# Patient Record
Sex: Male | Born: 1940 | Race: White | Hispanic: No | Marital: Single
Health system: Southern US, Community
[De-identification: ages and names within clinical notes are randomized; demographics above are authoritative.]

---

## 2014-06-25 ENCOUNTER — Ambulatory Visit
Admit: 2014-06-25 | Disposition: A | Discharge status: Home / Self Care | Payer: Self-pay | Attending: Internal Medicine | Admitting: Internal Medicine

## 2014-07-15 ENCOUNTER — Inpatient Hospital Stay: Payer: Self-pay | Admitting: Internal Medicine

## 2014-07-16 ENCOUNTER — Ambulatory Visit
Admit: 2014-07-16 | Disposition: A | Discharge status: Home / Self Care | Payer: Self-pay | Attending: Oncology | Admitting: Oncology

## 2014-07-22 ENCOUNTER — Emergency Department: Payer: Self-pay | Admitting: Emergency Medicine

## 2014-07-22 LAB — CANCER ANTIGEN 19-9: CA 19-9: 19300 U/mL — ABNORMAL HIGH (ref 0–35)

## 2014-07-26 ENCOUNTER — Ambulatory Visit
Admit: 2014-07-26 | Disposition: A | Discharge status: Home / Self Care | Payer: Self-pay | Attending: Internal Medicine | Admitting: Internal Medicine

## 2014-07-26 DEATH — deceased

## 2014-08-25 NOTE — Consult Note (Signed)
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 HPI:   Patient is a 74 year old male who recently moved [place] 9 days ago and was admitted to the ER with likely malignant ascites, left lower extremity cellulitis and DVT. He had a paracentesis and feels significantly improved. He was diagnosed with his pancreatic cancer in November or December 2015. He underwent minimal chemotherapy using capecitabine and oxaliplatin and was noted to have progression of disease on a recent CT scan on June 25, 2014. Patient expressed interest in transferring his care to [facility]. He has no neurologic complaints.  He denies any fevers. He has no chest pain or shortness of breath. He denies any nausea, vomiting, constipation, or diarrhea. He has no urinary complaints. Patient otherwise feels well and offers no further specific complaints.   Review of Systems:  Performance Status (ECOG): 1  Pain ?: No complaints (0, none)  Emotional well-being: None  Review of Systems:   As per HPI. Otherwise, a complete review of systems is negative.   Allergies:  No Known Allergies:   PFSH: Additional Past Medical and Surgical History: stage IV pancreatic cancer, biliary stenting, CAD, GERD, panic attacks. CABG.    Family history: 4 siblings deceased from lung cancer. Both mother and father also deceased from cancer unknown type.    Social history: Patient denies tobacco or alcohol.   Home Medications: Medication Instructions Last Modified Date/Time  fentaNYL 50 mcg/hr transdermal film, extended release 1 patch transdermal every 72 hours 21-Mar-16 11:16  omeprazole 40 mg oral delayed release capsule 1 cap(s) orally once a day 21-Mar-16 11:16  furosemide 20 mg oral tablet 1 tab(s) orally once a day 21-Mar-16 11:16  morphine 15 mg oral tablet 1 tab(s) orally every 4 hours, As Needed 21-Mar-16 11:16  megestrol 40 mg/mL oral suspension 40  milliliter(s) orally once a day 21-Mar-16 11:16   Vital Signs:  :: vital signs stable, patient afebrile.   Physical Exam:  General: well developed, well nourished, and in no acute distress  Mental Status: normal affect  Eyes: anicteric sclera  Head, Ears, Nose,Throat: Normocephalic, moist mucous membranes, clear oropharynx without erythema or thrush.  Neck, Thyroid: No palpable lymphadenopathy, thyroid midline without nodules.  Respiratory: clear to auscultation bilaterally  Cardiovascular: regular rate and rhythm, no murmur, rub, or gallop  Gastrointestinal: soft, nondistended, nontender, no organomegaly.  normal active bowel sounds  Musculoskeletal: No edema  Skin: No rash or petechiae noted  Neurological: alert, answering all questions appropriately.  Cranial nerves grossly intact   Laboratory Results: Hepatic:  22-Mar-16 05:35   Bilirubin, Total  3.5 (0.3-1.2 NOTE: New Reference Range  06/18/14)  Alkaline Phosphatase  298 (38-126 NOTE: New Reference Range  06/18/14)  SGPT (ALT)  13 (17-63 NOTE: New Reference Range  06/18/14)  SGOT (AST) 31 (15-41 NOTE: New Reference Range  06/18/14)  Total Protein, Serum  4.5 (6.5-8.1 NOTE: New Reference Range  06/18/14)  Albumin, Serum  1.6 (3.5-5.0 NOTE: New reference range  06/18/14)  Routine Chem:  22-Mar-16 05:35   Result Comment LABS - This specimen was collected through an   - indwelling catheter or arterial line.  - A minimum of 55ms of blood was wasted prior    - to collecting the sample.  Interpret  - results with caution.  Result(s) reported on 16 Jul 2014 at 06:06AM.  Glucose, Serum  102 (65-99 NOTE: New Reference Range  06/18/14)  BUN  34 (6-20 NOTE:  New Reference Range  06/18/14)  Creatinine (comp)  1.42 (0.61-1.24 NOTE: New Reference Range  06/18/14)  Sodium, Serum 136 (135-145 NOTE: New Reference Range  06/18/14)  Potassium, Serum 3.8 (3.5-5.1 NOTE: New Reference Range  06/18/14)  Chloride, Serum 106  (101-111 NOTE: New Reference Range  06/18/14)  CO2, Serum 26 (22-32 NOTE: New Reference Range  06/18/14)  Calcium (Total), Serum  7.6 (8.9-10.3 NOTE: New Reference Range  06/18/14)  eGFR (African American)  56  eGFR (Non-African American)  48 (eGFR values <79m/min/1.73 m2 may be an indication of chronic kidney disease (CKD). Calculated eGFR is useful in patients with stable renal function. The eGFR calculation will not be reliable in acutely ill patients when serum creatinine is changing rapidly. It is not useful in patients on dialysis. The eGFR calculation may not be applicable to patients at the low and high extremes of body sizes, pregnant women, and vegetarians.)  Anion Gap  4  Routine Hem:  22-Mar-16 05:35   WBC (CBC) 9.0  RBC (CBC)  2.70  Hemoglobin (CBC)  8.9  Hematocrit (CBC)  27.2  Platelet Count (CBC)  121  MCV  101  MCH 32.8  MCHC 32.6  RDW  16.9  Neutrophil % 77.0  Lymphocyte % 13.1  Monocyte % 8.1  Eosinophil % 1.5  Basophil % 0.3  Neutrophil #  7.0  Lymphocyte # 1.2  Monocyte # 0.7  Eosinophil # 0.1  Basophil # 0.0   Medical Imaging Results:   Review Medical Imaging   Color Flow Doppler Low Extrem Left (Leg) 15-Jul-2014 13:36:00: IMPRESSION:  1. Extensive occlusive LEFT DVT extending from the peroneal vein  through the femoral-popliteal system.      Electronically Signed    By: DLucrezia EuropeM.D.    On: 07/15/2014 13:45         Verified By: DKandis Cocking M.D., Guided Paracentesis 15-Jul-2014 15:01:00: IMPRESSION:  Successful ultrasound guided paracentesis yielding 9.2 L of ascites.      Electronically Signed    By: AMarkus DaftM.D.    On: 07/15/2014 15:06         Verified By: ABurman Riis M.D.,  Assessment and Plan: Impression:   Stage IV pancreatic cancer with metastatic disease in his liver, DVT. Plan:   1. Pancreatic cancer: [place], patient expressed  interest in attempting a different regimen of chemotherapy. He also expressed interest and not disrupting his quality of life. Will get a CA-19-9 for completeness. [place] and treatment planning.DVT: Agree with Eliquis.Family dynamics: Patient has clearly stated that he does not wish to discuss his care with his estranged wife or [place] His niece who was not present at the time is his power of attorney. palliative care input. consult, call with questions.  Electronic Signatures: FDelight Hoh(MD)  (Signed 22-Mar-16 13:32)  Authored: Note Type, CC/HPI, Review of Systems, ALLERGIES, Patient Family Social History, HOME MEDICATIONS, Vital Signs, Physical Exam, Lab Results Review, Rad Results Review, Assessment and Plan   Last Updated: 22-Mar-16 13:32 by FDelight Hoh(MD)

## 2014-08-25 NOTE — Discharge Summary (Signed)
PATIENT NAME:  [name], Micheal W MR#:  [id number] DATE OF BIRTH:  01-10-1941  DATE OF ADMISSION:  07/15/2014 DATE OF DISCHARGE:  07/16/2014  CHIEF COMPLAINT: Abdominal discomfort.   DISCHARGE DIAGNOSES:  Left lower extremity extensive deep vein thrombosis and cellulitis.  2.  Pancreatic cancer. 3.  Status post paracentesis.  4.  Cachexia and malnutrition.   PROCEDURES: Abdominal paracentesis removal of 9 liters.   CODE STATUS: None, DO NOT RESUSCITATE.   MEDICATIONS:  1.  Fentanyl patch 50 mcg 1 every 72 hours.  2.  Omeprazole 40 mg daily.  3.  Lasix 20 mg daily.  4.  Morphine 15 mg p.o. every 4 hours as needed.  5.  Megace 400 mg p.o. daily.  6.  Eliquis 10 mg b.i.d. for 7 days and then 5 mg b.i.d.  7.  Trazodone 75 mg at bedtime.  8.  Alprazolam 0.25 b.i.d. as needed for anxiety.  9.  Keflex 500 mg eight hourly.  10.  Ensure Plus 1 can b.i.d.   DIET: Low sodium.   FOLLOWUP: With Dr. Orlie DakinFinnegan on your scheduled appointment.   The patient and family advised on finding primary care physician in the area.   CONSULTATIONS: 1.  Oncology consult Dr. Orlie DakinFinnegan. 2.  Palliative care consult.   LABORATORY DATA:   1.  H8.9 and 37.2. White count is 9.0, platelet count is 121,000.  Creatinine is 1.42, BUN is 34, glucose is 102, sodium is 136, potassium is 3.8, chloride is 106, bicarbonate is 26. Urinalysis negative for UTI.  2.  Ultrasound of the lower extremities showed extensive left DVT extending through the femoral popliteal system.  3.  PT/INR 17.4 and 1.4.   BRIEF SUMMARY OF HOSPITAL COURSE:  Mr. Micheal Ball is a 74 year old Caucasian gentleman who [place] He has history of pancreatic cancer and came in with:  1.  Cellulitis of the left lower extremity with leukocytosis, tachycardia consistent with systemic inflammatory response syndrome.  He was started on IV Ancef, changed to p.o. Keflex. His redness was improving.  2.  New extensive DVT left  lower extremity. The patient was started on Eliquis 10 mg b.i.d. and then 10 mg b.i.d. for 7 days and will continue 5 mg b.i.d. thereafter.  3.  Acute renal failure. Received some IV fluids.  4.  Abdominal pain with ascites likely from massive ascites.  The patient underwent paracentesis with removal of 9.2 liters of fluid.  5.  Pancreatic cancer with jaundice with recent biliary stent placement The patient was seen by oncology and palliative care.  He is a DO NOT RESUSCITATE. Will follow up with Dr. Orlie DakinFinnegan for further options on treatment.  6.  Chronic pain. Continue fentanyl patch.  7.  Malnutrition with low albumin and cachexia.   The patient was desperate to go home.  I spoke with the patient's niece who is the primary caregiver and agrees with patient going home.  Hospital stay otherwise remained stable. The patient carries a poor prognosis.   TIME SPENT: 40 minutes.    ____________________________ Wylie HailSona A. Allena KatzPatel, MD sap:sp D: 07/18/2014 06:46:37 ET T: 07/18/2014 11:58:15 ET JOB#: [id number]  cc: Dominick Zertuche A. Allena KatzPatel, MD, <Dictator> Tollie Pizzaimothy J. Orlie DakinFinnegan, MD Willow OraSONA A Aleshka Corney MD ELECTRONICALLY SIGNED 07/29/2014 12:20

## 2014-08-25 NOTE — H&P (Signed)
PATIENT NAME:  Micheal Ball, Micheal Ball MR#:  [id number] DATE OF BIRTH:  08/13/1940  DATE OF ADMISSION:  07/15/2014  PRIMARY CARE PHYSICIAN:  Up in [place]. Patient moved to [facility] 9 days ago.   CHIEF COMPLAINT: Abdominal discomfort.   HISTORY OF PRESENT ILLNESS: This is a 74 year old man who was undergoing treatment for pancreatic cancer at [facility]. He has also been receiving paracentesis to remove fluid. His abdomen got more distended and he was having pain because of the distention. He went for paracentesis in the ER and had 9.2 liters drawn off with paracentesis. Now his abdomen feels better, but also while he was in the ER, he was diagnosed with a DVT and also has pain on that left lower extremity with some redness there. Hospitalist services were contacted for further evaluation. The patient complains of 3/10 pain in his abdomen down to his usual, but it was up at 8/10 prior to coming in here. He has been having more fluid in his abdomen for the past 3 days. He has been having some nausea and spitting up. He has already received 7 or 8 cycles of chemotherapy up at [facility] for his pancreatic cancer and a recent stent for jaundice. He has been having weight loss and fatigue, some pain in his left lower extremity. Hospitalist services were contacted for further evaluation.   PAST MEDICAL HISTORY: Pancreatic cancer. Jaundice, status post biliary stenting, coronary artery disease, panic attacks, insomnia, gastroesophageal reflux disease.   PAST SURGICAL HISTORY: CABG, biliary stent.  ALLERGIES: No known drug allergies.   MEDICATIONS: As per prescription writer include fentanyl 50 mcg chest wall every 72 hours, furosemide 20 mg daily, Megace 40 mg/mL 40 mL once a day, morphine 15 mg every 4 hours as needed, omeprazole 40 mg daily.   SOCIAL HISTORY: No smoking. No alcohol. No drug use. Recently separated from his wife. Lives now with niece over at the [facility]. He used to work  [employer] in the past.   FAMILY HISTORY: Two brothers died of cancer, a sister died of cancer. Father died of cancer. Mother died of cancer, unknown cancer types.  REVIEW OF SYSTEMS:  CONSTITUTIONAL: Positive for chills. No fever. No sweats.  Positive for weight loss. Positive for fatigue.  EYES: No blurry vision.  EARS, NOSE, MOUTH, AND THROAT: No hearing loss. No sore throat. No difficulty swallowing.  CARDIOVASCULAR: No chest pain. No palpitations.  RESPIRATORY: No shortness of breath. No cough. No sputum. No hemoptysis.  GASTROINTESTINAL: Positive for nausea, spitting up, positive for abdominal pain. Positive for constipation. No bright red blood per rectum. No melena.  GENITOURINARY: No burning on urination. No hematuria.  MUSCULOSKELETAL: No joint pain or muscle pain.  INTEGUMENT: Positive for rash on the left lower extremity.  NEUROLOGIC: No fainting or blackouts.  PSYCHIATRIC: Positive for anxiety, positive for depression.  ENDOCRINE: No thyroid problems.  HEMATOLOGIC AND LYMPHATIC: No anemia, no easy bruising, or bleeding.   PHYSICAL EXAMINATION: VITAL SIGNS: On presentation to the ER include pulse 104, respirations 20, blood pressure 125/100, pulse oximetry 100% on room air, temperature 97.9. Current temperature 97.9, pulse 82, respirations 18, blood pressure 108/79, pulse oximetry 99% on room air.  GENERAL: No respiratory distress, cachectic.  EYES: Conjunctivae jaundiced, lids normal. Pupils equal, round, and reactive to light. Extraocular muscles intact. No nystagmus.  EARS, NOSE, MOUTH, AND THROAT: Tympanic membranes, no erythema. Nasal mucosa, no erythema. Throat, no erythema. No exudate seen. Lips and gums, no lesions.  NECK: No  JVD. No bruits. No lymphadenopathy. No thyromegaly. No thyroid nodules palpated.  RESPIRATORY:  Lungs clear to auscultation. No use of accessory muscles to breathe. No rhonchi, rales, or wheeze heard.  CARDIOVASCULAR: S1, S2 normal. No gallops,  rubs, or murmurs heard. Carotid upstroke 2+ bilaterally. No bruits. Dorsalis pedis pulses 2+ bilaterally, 2+ edema bilateral lower extremity.  ABDOMEN: Soft. Slight tenderness, generalized. No organomegaly/splenomegaly. Normoactive bowel sounds.  LYMPHATIC: No lymph nodes in the neck.  MUSCULOSKELETAL: No clubbing, no cyanosis.  SKIN: No ulcers seen. Positive rash, redness, and warmth of the left lower extremity, a little bit above the ankle and below the knee anteriorly consistent with cellulitis.  NEUROLOGIC: Cranial nerves II-XII grossly intact. Deep tendon reflexes are 1+ bilateral lower extremities.  PSYCHIATRIC: The patient is oriented to person, place, and time. 2+ edema bilateral lower extremity. No clubbing. No cyanosis.   LABORATORY AND RADIOLOGICAL DATA: Troponin borderline at 0.04, PT 17.4, INR 1.4. Lipase 45. White blood cell count 12.7, H and H, 10.1 and 31.2, platelet count of 177,000, glucose 132, BUN 37, creatinine 1.72, sodium 134, potassium 4.0, chloride 103, CO2 of 21, calcium 8.0, total bilirubin 4.8, alkaline phosphatase 410, ALT 18, AST 39, total protein 5.8, albumin 1.9. Ultrasound of the left lower extremity showed extensive, occlusive left DVT  extending from the peroneal vein through the femoral popliteal system. Ultrasound-guided paracentesis, total of approximately 9.2 liters of amber-colored fluid was removed.   ASSESSMENT AND PLAN: 1.  Cellulitis of the left lower extremity with leukocytosis, tachycardia consistent with systemic inflammatory response syndrome. We will give intravenous Ancef.  2.  Acute renal failure, likely acute tubular necrosis. We will give gentle intravenous fluid hydration and hold Lasix at this time.   3.  New deep venous thrombosis of the left lower extremity. We will discontinue Megace, start Eliquis 10 mg b.i.d., then can go down to 5 mg b.i.d. We will have to be dosed on kidney function upon discharge.  4.  Abdominal pain with ascites, likely  from the massive ascites that was removed. The patient's abdominal pain improved. Would consider prophylactic spontaneous bacterial peritonitis  treatment upon discharge.  5.  Pancreatic cancer with jaundice, recent biliary stent. Overall prognosis is poor. The patient wanted to be a full code when I saw him. I will get palliative care consultation and oncology consultation for further recommendations. Unlikely would be a hospice candidate.  6.  Chronic pain. Continue fentanyl patch and morphine.  7.  Gastroesophageal reflux disease without esophagitis. Continue Protonix.  8.  Malnutrition with a low albumin and cachexia, body mass index 20.5, high mortality just with poor nutritional status.  9.  Elevated troponin. I am not working this up further. I will not order further cardiac markers or telemetry monitoring at this point.   TIME SPENT ON ADMISSION: 55 minutes.   CODE STATUS: The patient was a full code when I saw him.    ____________________________ Herschell Dimesichard J. Renae GlossWieting, MD rjw:LT D: 07/15/2014 17:44:06 ET T: 07/15/2014 18:31:24 ET JOB#: [id number]  cc: Herschell Dimesichard J. Renae GlossWieting, MD, <Dictator> Salley ScarletICHARD J Vada Yellen MD ELECTRONICALLY SIGNED 07/16/2014 17:31

## 2015-11-04 IMAGING — US US EXTREM LOW VENOUS*L*
1 series · 14 of 24 positions shown · non-contrast
Comparison: None

CLINICAL DATA: Edema, redness at the ankle, history of DVT/ PE.
Pancreatic malignancy. Previous tobacco abuse.

EXAM:
LEFT LOWER EXTREMITY VENOUS DOPPLER ULTRASOUND
TECHNIQUE: Gray-scale sonography with compression, as well as color and duplex
ultrasound, were performed to evaluate the deep venous system from
the level of the common femoral vein through the popliteal and
proximal calf veins.

[Series 1: us extrem low venous*left* · 0.08mm/px · 14 of 30 slices shown]
[im 1/30]
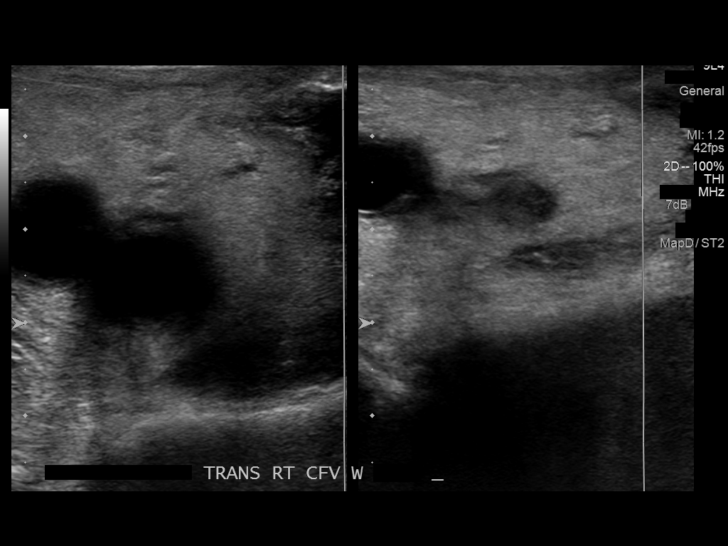
[im 3/30]
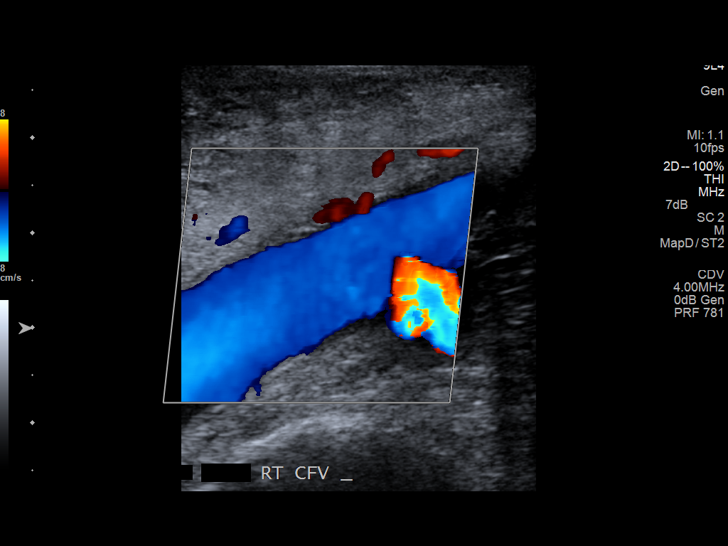
[im 6/30]
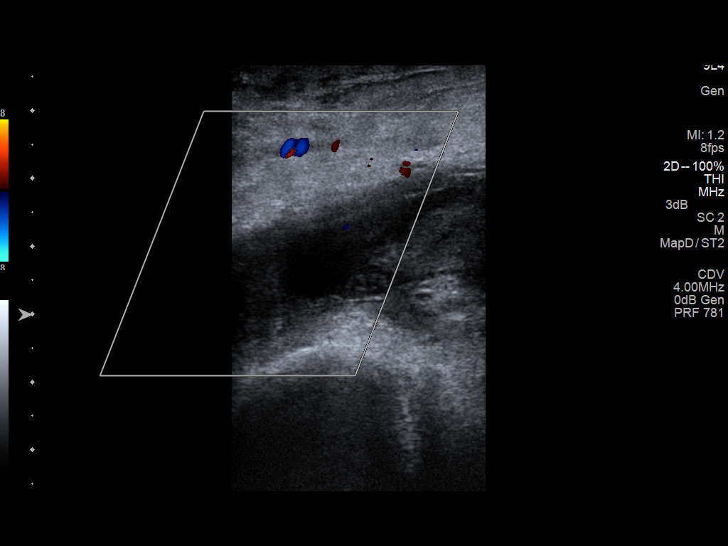
[im 8/30]
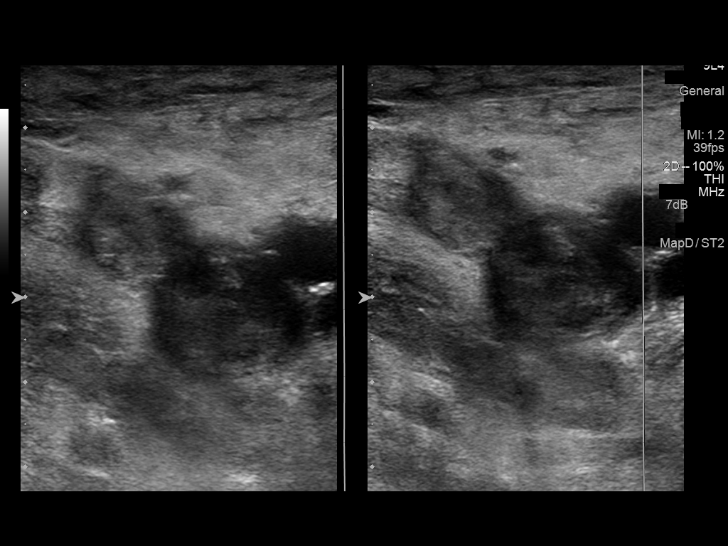
[im 9/30]
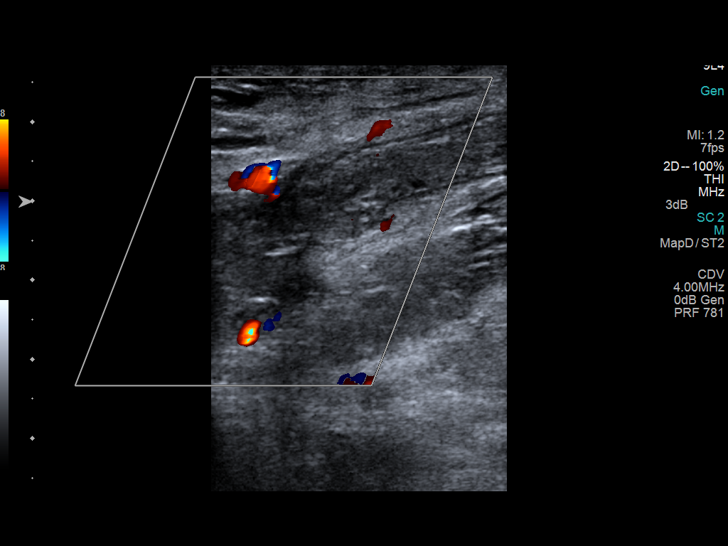
[im 12/30]
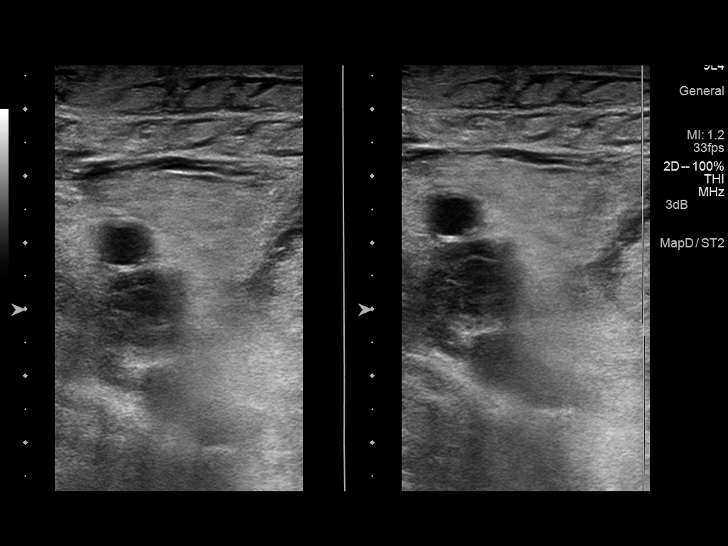
[im 14/30]
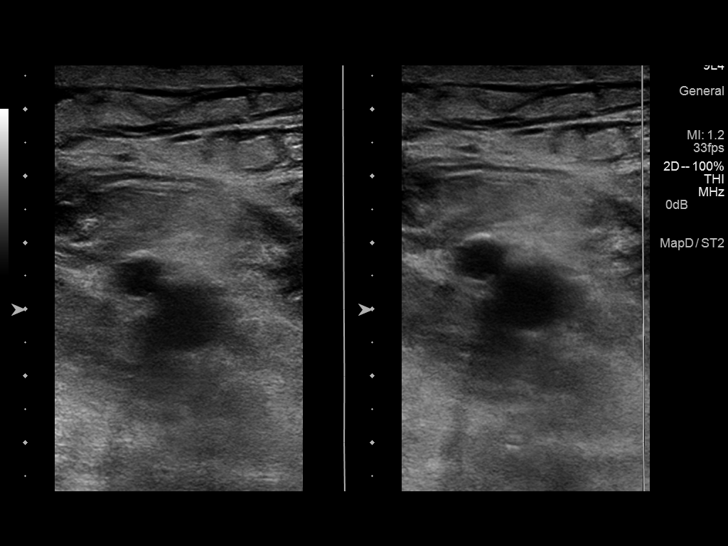
[im 16/30]
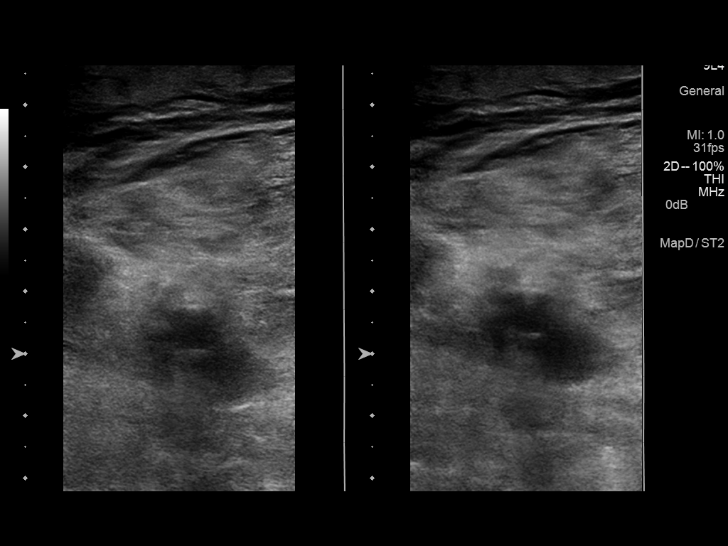
[im 18/30]
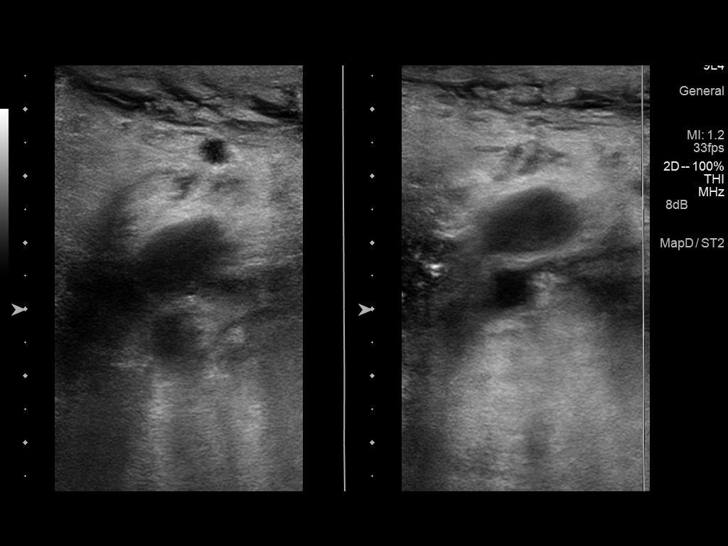
[im 21/30]
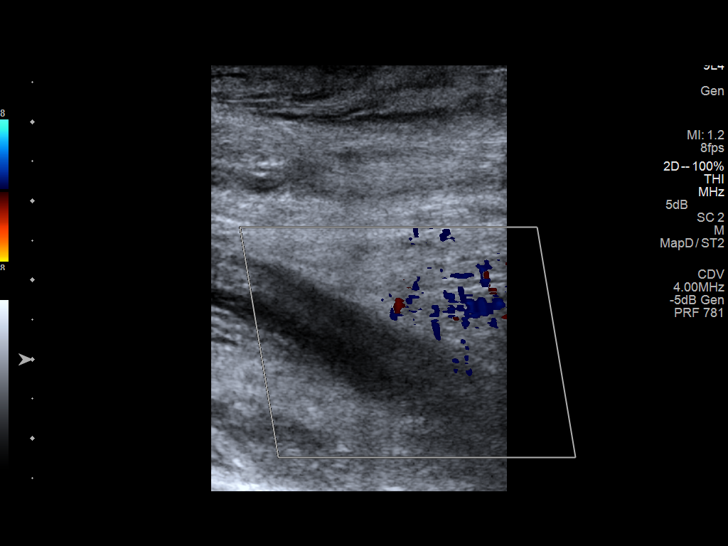
[im 23/30]
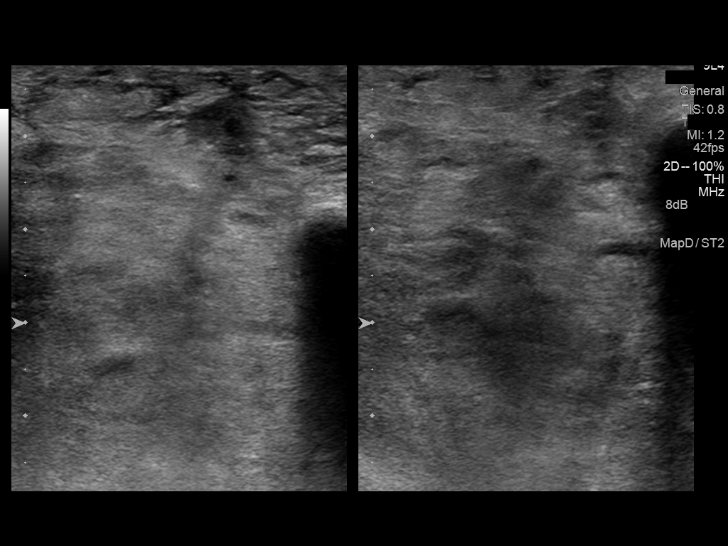
[im 24/30]
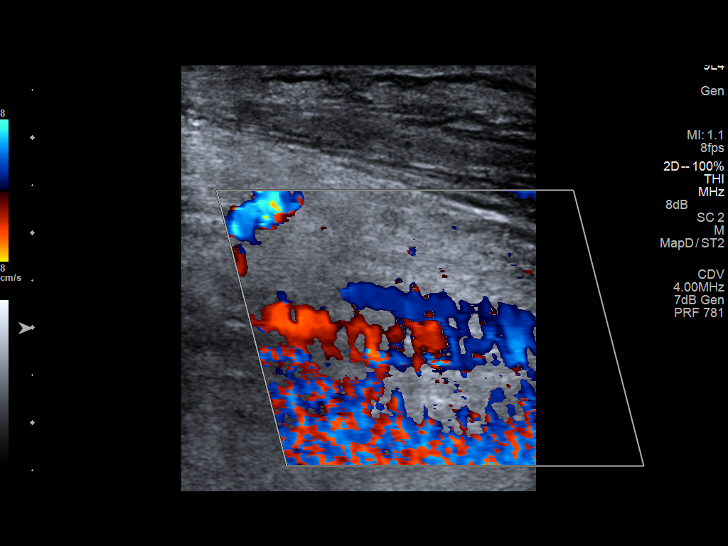
[im 27/30]
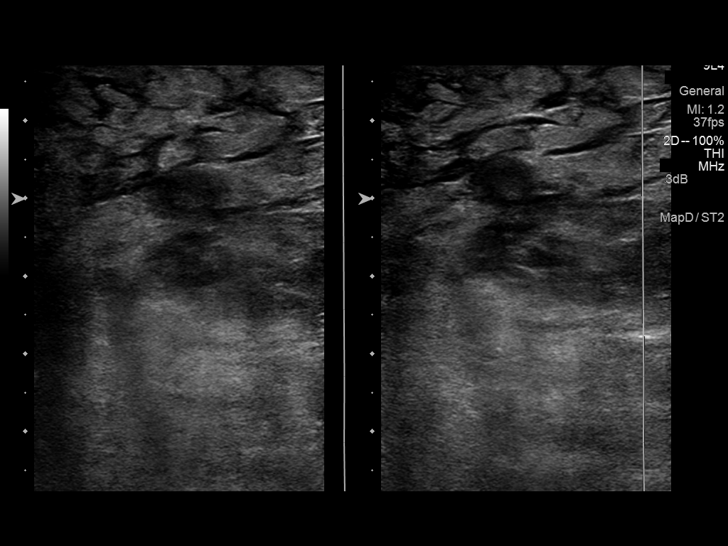
[im 30/30]
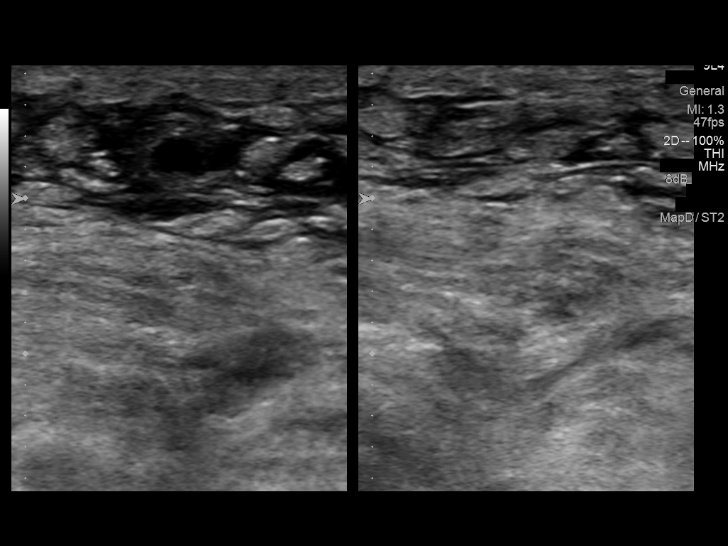

[14 of 24 positions shown; findings below may reference images not displayed]

FINDINGS: There is noncompressible occlusive thrombus extending from the
peroneal vein through the popliteal, femoral, and common femoral
veins. Limited flow signal on color Doppler interrogation through
these segments. Clot extends to the profunda femoral vein and across
the saphenofemoral junction to involve the central GSV. Edema in the
calf. Posterior tibial vein is patent and compressible. Survey views
of the contralateral common femoral vein are unremarkable.
IMPRESSION: 1. Extensive occlusive LEFT DVT extending from the peroneal vein
through the femoral-popliteal system.

## 2015-11-04 IMAGING — US US GUIDE NEEDLE - US PARA
2 series · 5 of 5 positions shown · non-contrast
Comparison: None.

CLINICAL DATA: Pancreatic cancer and recurrent ascites.

EXAM:
ULTRASOUND GUIDED THERAPEUTIC PARACENTESIS

[Series 1: us guide needle - us para · 0.29mm/px · 4 of 4 slices shown (1 of 2)]
[im 1/4]
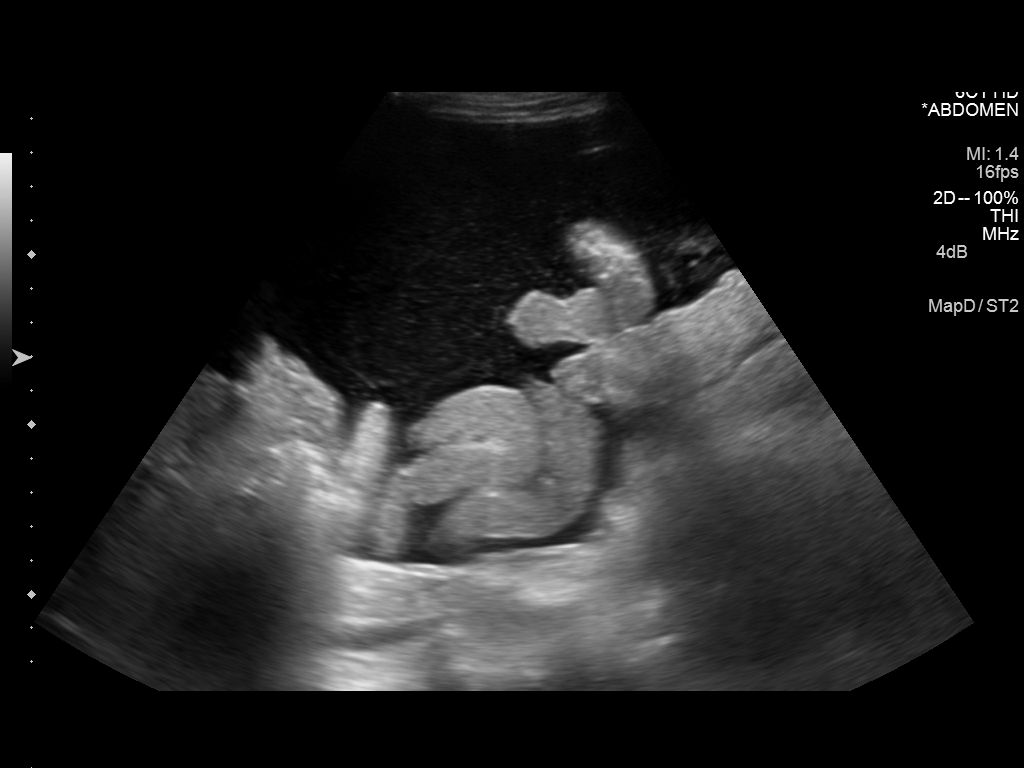
[im 2/4]
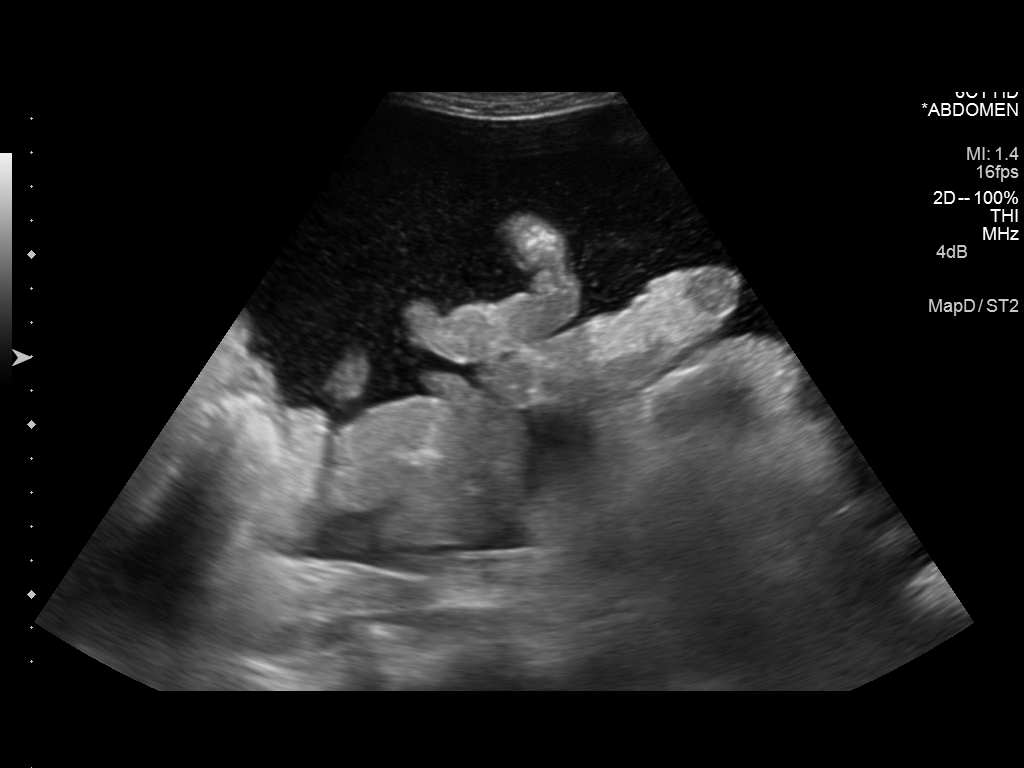
[im 3/4]
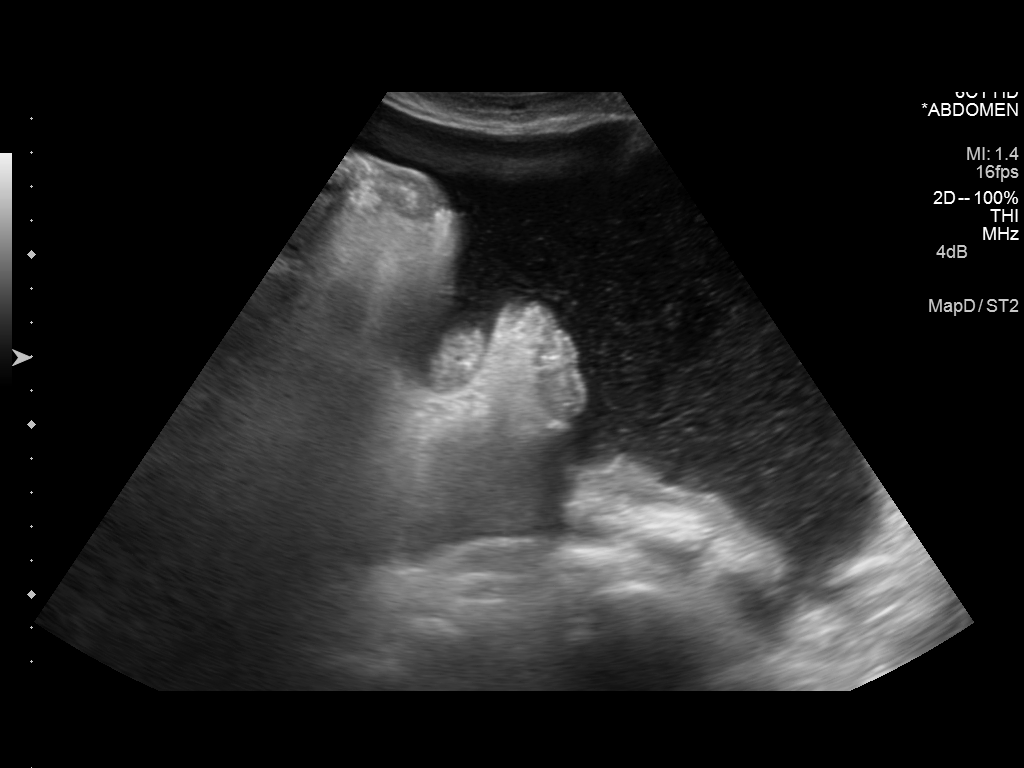
[im 4/4]
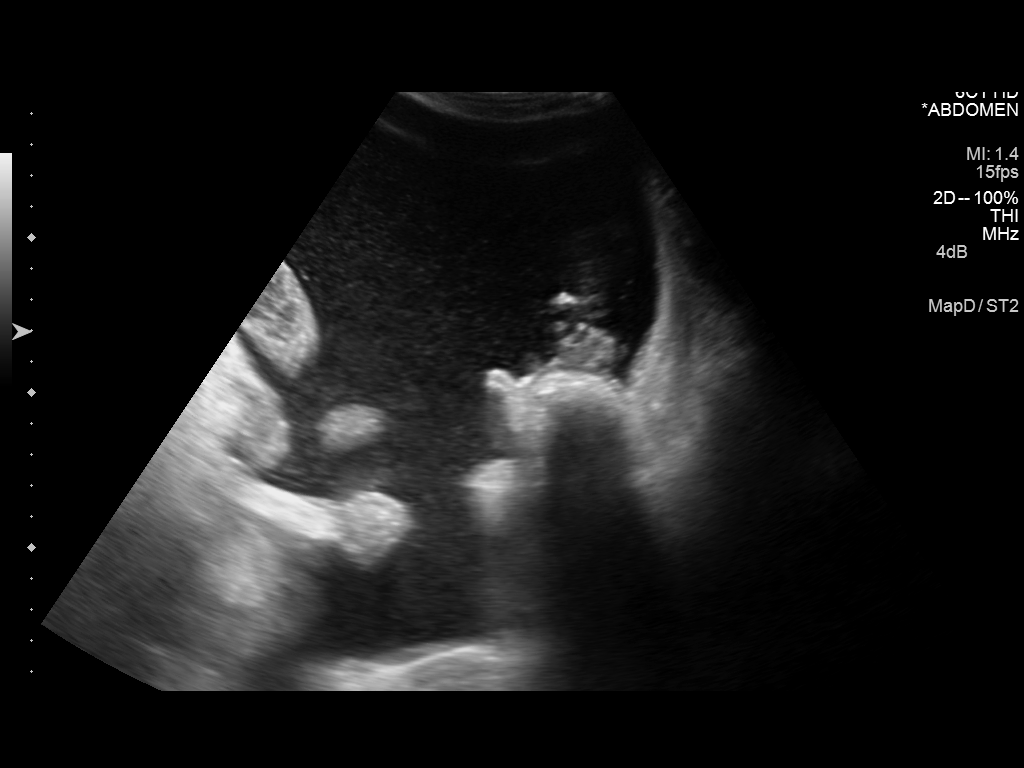

[Series 2: us guide needle - us para · 0.27mm/px · 1 of 1 slices shown (2 of 2)]
[im 1/1]
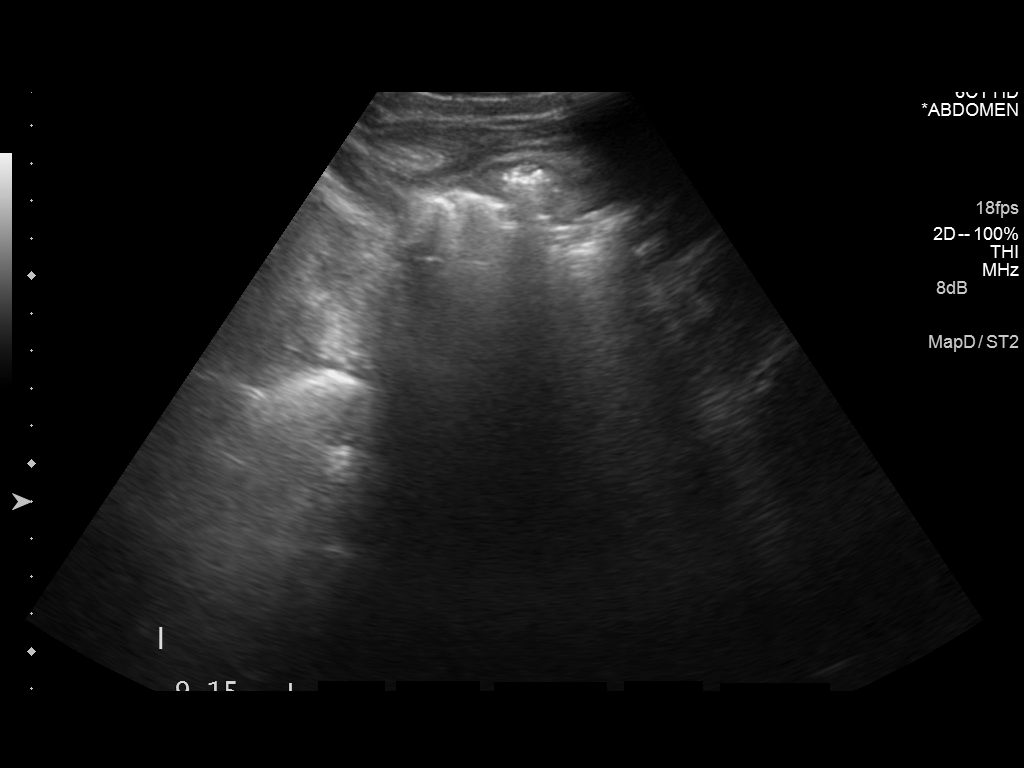

[5 of 5 positions shown; findings below may reference images not displayed]

PROCEDURE:
An ultrasound guided paracentesis was thoroughly discussed with the
patient and questions answered. The benefits, risks, alternatives
and complications were also discussed. The patient understands and
wishes to proceed with the procedure. Written consent was obtained.

Ultrasound was performed to localize and mark an adequate pocket of
fluid in the right lower quadrant of the abdomen. The area was then
prepped and draped in the normal sterile fashion. 1% Lidocaine was
used for local anesthesia. Under ultrasound guidance a
Safe-T-Centesis catheter was introduced. Paracentesis was performed.
The catheter was removed and a dressing applied.

Complications: None.
FINDINGS: A total of approximately 9.2 L of amber colored fluid was removed.
IMPRESSION: Successful ultrasound guided paracentesis yielding 9.2 L of ascites.
